# Patient Record
Sex: Male | Born: 1960 | Race: White | Hispanic: No | Marital: Married | Smoking: Never smoker
Health system: Southern US, Community
[De-identification: ages and names within clinical notes are randomized; demographics above are authoritative.]

## PROBLEM LIST (undated history)

## (undated) DIAGNOSIS — E785 Hyperlipidemia, unspecified: Secondary | ICD-10-CM

## (undated) DIAGNOSIS — N529 Male erectile dysfunction, unspecified: Secondary | ICD-10-CM

## (undated) DIAGNOSIS — I2510 Atherosclerotic heart disease of native coronary artery without angina pectoris: Secondary | ICD-10-CM

## (undated) DIAGNOSIS — Q2548 Anomalous origin of subclavian artery: Secondary | ICD-10-CM

## (undated) DIAGNOSIS — Q231 Congenital insufficiency of aortic valve: Principal | ICD-10-CM

## (undated) HISTORY — DX: Anomalous origin of subclavian artery: Q25.48

## (undated) HISTORY — DX: Male erectile dysfunction, unspecified: N52.9

## (undated) HISTORY — DX: Atherosclerotic heart disease of native coronary artery without angina pectoris: I25.10

## (undated) HISTORY — PX: OTHER SURGICAL HISTORY: SHX169

## (undated) HISTORY — DX: Hyperlipidemia, unspecified: E78.5

## (undated) HISTORY — DX: Congenital insufficiency of aortic valve: Q23.1

---

## 1997-08-07 ENCOUNTER — Other Ambulatory Visit: Admission: RE | Admit: 1997-08-07 | Discharge: 1997-08-07 | Payer: Self-pay | Admitting: Interventional Cardiology

## 1998-03-19 ENCOUNTER — Ambulatory Visit (HOSPITAL_COMMUNITY): Admission: RE | Admit: 1998-03-19 | Discharge: 1998-03-19 | Payer: Self-pay | Admitting: Emergency Medicine

## 2012-11-04 ENCOUNTER — Telehealth: Payer: Self-pay | Admitting: Interventional Cardiology

## 2012-11-04 DIAGNOSIS — I359 Nonrheumatic aortic valve disorder, unspecified: Secondary | ICD-10-CM

## 2012-11-04 DIAGNOSIS — I7789 Other specified disorders of arteries and arterioles: Secondary | ICD-10-CM

## 2012-11-04 NOTE — Telephone Encounter (Signed)
New Problem  Pt called request a call back about order a test// No other details/ Please call

## 2012-11-04 NOTE — Telephone Encounter (Signed)
returned Dr.Kroboth call. he would like to go ahead and hve ct scan w/contrast sooner that later. He would like for [place] to ck with precert first to see what the cost would be before scheduling.adv Dr.j I would f/u with precert and cll him back

## 2012-11-18 NOTE — Telephone Encounter (Signed)
called pt back with cpt code for a CT(A) [id number]. adv pt I will put orders in epic for a cta with contrast and calcium scoring pt verbalized understsnding

## 2012-11-19 NOTE — Telephone Encounter (Signed)
Left msg on pt's cell phone.  Need insurance information, i.e., insurance company name and policy# and customer svc #

## 2012-11-20 NOTE — Telephone Encounter (Signed)
called [facility] [phone] and spoke with Darl Pikes pt sch to have CT chest w/contrast.to evaluate aortic root size  on 12/02/12 @10 :30. Cheral Almas that they do not do Calcium scoring in that office. will fax order to [phone].lmom for pt with appt date and time.also adv pt they do not to calcium scoring.

## 2012-12-04 ENCOUNTER — Telehealth: Payer: Self-pay | Admitting: Interventional Cardiology

## 2012-12-04 NOTE — Telephone Encounter (Signed)
New message  Patient would like results of CT Angio, please call & advise.

## 2012-12-07 ENCOUNTER — Other Ambulatory Visit: Payer: Self-pay

## 2012-12-08 NOTE — Telephone Encounter (Signed)
called pt with resuts of cta of the chest.per Dr.Smith no aortic aneurysm,lad and cfx ca, 3-4 mm r lung nodule benign.pt to return call with any questions.

## 2012-12-08 NOTE — Telephone Encounter (Signed)
Follow Up  Requests results of the CT scan completed at [facility] on Nov 19 th// please call back

## 2012-12-09 NOTE — Telephone Encounter (Signed)
Follow up    Pt would like Dr. Katrinka Blazing to call him back to discuss the Ct results please.  Some time in the week of 12/1-5th

## 2012-12-18 ENCOUNTER — Encounter: Payer: Self-pay | Admitting: Interventional Cardiology

## 2013-09-30 ENCOUNTER — Telehealth: Payer: Self-pay | Admitting: Interventional Cardiology

## 2013-09-30 DIAGNOSIS — I359 Nonrheumatic aortic valve disorder, unspecified: Secondary | ICD-10-CM

## 2013-09-30 NOTE — Telephone Encounter (Signed)
New Message  Pt called requests a call back to clarify if he will need an ECHO and lab work before appt. Please call

## 2013-09-30 NOTE — Telephone Encounter (Signed)
Left message that message would be sent to Dr. Katrinka Blazing.

## 2013-10-01 NOTE — Telephone Encounter (Signed)
Lmom. Pt should have echo performed some time before his appt with Dr.Smith on 12/15/13. A scheduler form the office will call pt to schedule

## 2013-10-01 NOTE — Telephone Encounter (Signed)
Last echo was at [name] in August 2013. I believe he needs to have a repeat 2-D Doppler echocardiogram to follow aortic valve disease. It would be nice to have this done prior to the office visit.

## 2013-10-06 ENCOUNTER — Ambulatory Visit (HOSPITAL_COMMUNITY): Payer: PRIVATE HEALTH INSURANCE | Attending: Interventional Cardiology

## 2013-10-06 DIAGNOSIS — I359 Nonrheumatic aortic valve disorder, unspecified: Secondary | ICD-10-CM | POA: Diagnosis not present

## 2013-10-06 NOTE — Progress Notes (Signed)
2D Echo completed. 10/06/2013 

## 2013-10-08 ENCOUNTER — Telehealth: Payer: Self-pay

## 2013-10-08 NOTE — Telephone Encounter (Signed)
Message copied by Jarvis Newcomer on Fri Oct 08, 2013  4:38 PM ------      Message from: Verdis Prime      Created: Thu Oct 07, 2013  6:57 PM       Normal LV size and function. Aortic valve with Ao stenosis mild to moderate. Normal measured Ao root. ------

## 2013-10-08 NOTE — Telephone Encounter (Signed)
called to give pt echo results.lmtcb 

## 2013-10-12 NOTE — Telephone Encounter (Signed)
Patient informed. States he will discuss more about echo when he sees Dr. Katrinka BlazingSmith.

## 2013-10-12 NOTE — Telephone Encounter (Signed)
Follow Up  ° °Pt returned call for results °

## 2013-10-12 NOTE — Telephone Encounter (Signed)
Left message for pt to call back for results of 2 D Echo

## 2013-11-23 ENCOUNTER — Encounter: Payer: Self-pay | Admitting: Interventional Cardiology

## 2013-12-15 ENCOUNTER — Ambulatory Visit: Payer: Self-pay | Admitting: Interventional Cardiology

## 2014-02-09 ENCOUNTER — Ambulatory Visit: Payer: Self-pay | Admitting: Interventional Cardiology

## 2014-02-11 ENCOUNTER — Ambulatory Visit: Payer: Self-pay | Admitting: Interventional Cardiology

## 2014-03-07 ENCOUNTER — Encounter: Payer: Self-pay | Admitting: Interventional Cardiology

## 2014-03-07 ENCOUNTER — Ambulatory Visit (INDEPENDENT_AMBULATORY_CARE_PROVIDER_SITE_OTHER): Payer: PRIVATE HEALTH INSURANCE | Admitting: Interventional Cardiology

## 2014-03-07 VITALS — BP 104/80 | HR 45 | Ht 66.5 in | Wt 150.8 lb

## 2014-03-07 DIAGNOSIS — I351 Nonrheumatic aortic (valve) insufficiency: Secondary | ICD-10-CM | POA: Insufficient documentation

## 2014-03-07 DIAGNOSIS — E785 Hyperlipidemia, unspecified: Secondary | ICD-10-CM | POA: Insufficient documentation

## 2014-03-07 DIAGNOSIS — N529 Male erectile dysfunction, unspecified: Secondary | ICD-10-CM | POA: Insufficient documentation

## 2014-03-07 DIAGNOSIS — Q231 Congenital insufficiency of aortic valve: Secondary | ICD-10-CM

## 2014-03-07 DIAGNOSIS — Q2381 Bicuspid aortic valve: Secondary | ICD-10-CM

## 2014-03-07 HISTORY — DX: Congenital insufficiency of aortic valve: Q23.1

## 2014-03-07 HISTORY — DX: Bicuspid aortic valve: Q23.81

## 2014-03-07 HISTORY — DX: Hyperlipidemia, unspecified: E78.5

## 2014-03-07 HISTORY — DX: Male erectile dysfunction, unspecified: N52.9

## 2014-03-07 NOTE — Progress Notes (Signed)
Patient ID: Duane Arnold Mcalpine, male   DOB: 05/22/1960, 54 y.o.   MRN: [id number]     Cardiology Office Note   Date:  03/07/2014   ID:  Duane Arnold Wasilewski, DOB 05/17/1960, MRN [id number]  PCP:  No primary care provider on file.  Cardiologist:   Lesleigh NoeSMITH III,HENRY W, MD   No chief complaint on file.     History of Present Illness: Duane Arnold Lad is a 54 y.o. male who presents for a crisp aortic valve. He is asymptomatic. He continues exercises I had syncope, dyspnea, or chest discomfort. No palpitations or neurological complaints. He is on atorvastatin 40 mg per day.Pete Glatter.Stoneking is now his primary care physician.    History reviewed. No pertinent past medical history.  Past Surgical History  Procedure Laterality Date  . None       Current Outpatient Prescriptions  Medication Sig Dispense Refill  . aspirin 81 MG tablet Take 81 mg by mouth daily.    Marland Kitchen. atorvastatin (LIPITOR) 40 MG tablet Take 40 mg by mouth daily.    . sildenafil (VIAGRA) 50 MG tablet Take 50 mg by mouth daily as needed for erectile dysfunction.    Marland Kitchen. zolpidem (AMBIEN) 10 MG tablet Take 10 mg by mouth at bedtime as needed for sleep.     No current facility-administered medications for this visit.    Allergies:   Review of patient's allergies indicates no known allergies.    Social History:  The patient  reports that he has never smoked. He does not have any smokeless tobacco history on file. He reports that he drinks about 0.6 oz of alcohol per week. He reports that he does not use illicit drugs.   Family History:  The patient's family history includes Dementia in his mother; Hypertension in his father and mother.    ROS:  Please see the history of present illness.   Otherwise, review of systems are positive for none.   All other systems are reviewed and negative.    PHYSICAL EXAM: VS:  BP 104/80 mmHg  Pulse 45  Ht 5' 6.5" (1.689 m)  Wt 150 lb 12.8 oz (68.402 kg)  BMI 23.98 kg/m2 , BMI Body mass index is 23.98  kg/(m^2). GEN: Well nourished, well developed, in no acute distress HEENT: normal Neck: no JVD, carotid bruits, or masses Cardiac: RRR; real 6 systolic crescendo decrescendo murmur at the right upper sternal border and left midsternal border. Otherwise rub and no edema . An S4 gallop is audible. Respiratory:  clear to auscultation bilaterally, normal work of breathing GI: soft, nontender, nondistended, + BS MS: no deformity or atrophy Skin: warm and dry, no rash Neuro:  Strength and sensation are intact Psych: euthymic mood, full affect   EKG:  EKG is ordered today. The ekg ordered today demonstrates sinus bradycardia with prominent voltage. PR interval 234 ms. EKG is unchanged from prior tracings.   Recent Labs: No results found for requested labs within last 365 days.    Lipid Panel No results found for: CHOL, TRIG, HDL, CHOLHDL, VLDL, LDLCALC, LDLDIRECT    Wt Readings from Last 3 Encounters:  03/07/14 150 lb 12.8 oz (68.402 kg)      Other studies Reviewed: Additional studies/ records that were reviewed today include: .    ASSESSMENT AND PLAN:  1.  Bicuspid aortic valve, clinically stable without any evidence of volume overload. We reviewed the results of the recent echocardiogram from September 2015. There has been mild progression compared with a study done in 2013.  2. The ascending aorta is normal in size.   Current medicines are reviewed at length with the patient today.  The patient does not have concerns regarding medicines.  The following changes have been made:  no change  Labs/ tests ordered today include: The Doppler echocardiogram September 2016  No orders of the defined types were placed in this encounter.     Disposition:   FU with Mendel Ryder in 7 months     Signed, Lesleigh Noe, MD  03/07/2014 2:42 PM    [facility] [address] [place], [place], [place]  [address] Phone: [phone]; Fax: [phone]

## 2014-03-07 NOTE — Patient Instructions (Addendum)
Your physician recommends that you continue on your current medications as directed. Please refer to the Current Medication list given to you today.  Your physician has requested that you have an echocardiogram. Echocardiography is a painless test that uses sound waves to create images of your heart. It provides your doctor with information about the size and shape of your heart and how well your heart's chambers and valves are working. This procedure takes approximately one hour. There are no restrictions for this procedure.( To be scheduled in September 2016)   Your physician wants you to follow-up in: November 2016 You will receive a reminder letter in the mail two months in advance. If you don't receive a letter, please call our office to schedule the follow-up appointment.

## 2014-10-25 ENCOUNTER — Telehealth: Payer: Self-pay

## 2014-10-25 NOTE — Telephone Encounter (Signed)
F/u  Pt returning Lisa's phone call. Please call back and discuss 

## 2014-10-25 NOTE — Telephone Encounter (Signed)
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 lmtcb

## 2014-10-25 NOTE — Telephone Encounter (Signed)
Pt aware of 1 yr f/u scheduled with Dr.Smith on 11/14 @ 12:15pm. Adv pt that a scheduler will call him to schedule his echo prior to his appt. Pt voiced appreciation and verbalized understanding.

## 2014-11-01 ENCOUNTER — Ambulatory Visit (HOSPITAL_COMMUNITY): Payer: PRIVATE HEALTH INSURANCE | Attending: Internal Medicine

## 2014-11-01 ENCOUNTER — Other Ambulatory Visit: Payer: Self-pay

## 2014-11-01 ENCOUNTER — Other Ambulatory Visit: Payer: Self-pay | Admitting: Interventional Cardiology

## 2014-11-01 DIAGNOSIS — I350 Nonrheumatic aortic (valve) stenosis: Secondary | ICD-10-CM | POA: Diagnosis not present

## 2014-11-01 DIAGNOSIS — I340 Nonrheumatic mitral (valve) insufficiency: Secondary | ICD-10-CM | POA: Insufficient documentation

## 2014-11-01 DIAGNOSIS — I059 Rheumatic mitral valve disease, unspecified: Secondary | ICD-10-CM | POA: Insufficient documentation

## 2014-11-01 DIAGNOSIS — Q231 Congenital insufficiency of aortic valve: Secondary | ICD-10-CM | POA: Diagnosis not present

## 2014-11-01 DIAGNOSIS — I517 Cardiomegaly: Secondary | ICD-10-CM | POA: Diagnosis not present

## 2014-11-27 DIAGNOSIS — Q2548 Anomalous origin of subclavian artery: Secondary | ICD-10-CM | POA: Insufficient documentation

## 2014-11-27 HISTORY — DX: Anomalous origin of subclavian artery: Q25.48

## 2014-11-28 ENCOUNTER — Ambulatory Visit (INDEPENDENT_AMBULATORY_CARE_PROVIDER_SITE_OTHER): Payer: PRIVATE HEALTH INSURANCE | Admitting: Interventional Cardiology

## 2014-11-28 ENCOUNTER — Encounter: Payer: Self-pay | Admitting: Interventional Cardiology

## 2014-11-28 VITALS — BP 106/72 | HR 53 | Ht 67.0 in | Wt 153.8 lb

## 2014-11-28 DIAGNOSIS — Q2548 Anomalous origin of subclavian artery: Secondary | ICD-10-CM | POA: Diagnosis not present

## 2014-11-28 DIAGNOSIS — Q231 Congenital insufficiency of aortic valve: Secondary | ICD-10-CM

## 2014-11-28 DIAGNOSIS — I7789 Other specified disorders of arteries and arterioles: Secondary | ICD-10-CM

## 2014-11-28 DIAGNOSIS — I2510 Atherosclerotic heart disease of native coronary artery without angina pectoris: Secondary | ICD-10-CM

## 2014-11-28 DIAGNOSIS — E785 Hyperlipidemia, unspecified: Secondary | ICD-10-CM

## 2014-11-28 HISTORY — DX: Atherosclerotic heart disease of native coronary artery without angina pectoris: I25.10

## 2014-11-28 NOTE — Patient Instructions (Signed)
Medication Instructions:  Your physician recommends that you continue on your current medications as directed. Please refer to the Current Medication list given to you today.   Labwork: None ordered  Testing/Procedures: Your physician has requested that you have an echocardiogram. Echocardiography is a painless test that uses sound waves to create images of your heart. It provides your doctor with information about the size and shape of your heart and how well your heart's chambers and valves are working. This procedure takes approximately one hour. There are no restrictions for this procedure. ( To be scheduled in Nov 2017)  Non-Cardiac CT Angiography (CTA), is a special type of CT scan that uses a computer to produce multi-dimensional views of major blood vessels throughout the body. In CT angiography, a contrast material is injected through an IV to help visualize the blood vessels (To be scheduled in Nov 2017)   Follow-Up: Your physician wants you to follow-up in: 1 year with Dr.Smith will receive a reminder letter in the mail two months in advance. If you don't receive a letter, please call our office to schedule the follow-up appointment.   Any Other Special Instructions Will Be Listed Below (If Applicable).     If you need a refill on your cardiac medications before your next appointment, please call your pharmacy.

## 2014-11-28 NOTE — Progress Notes (Signed)
Cardiology Office Note   Date:  11/28/2014   ID:  Duane SouSam Noori, DOB 05/30/1960, MRN [id number]  PCP:  Ginette OttoSTONEKING,HAL THOMAS, MD  Cardiologist:  Lesleigh NoeSMITH III,Christoph Copelan W, MD   Chief Complaint  Patient presents with  . Cardiac Valve Problem      History of Present Illness: Duane SouSam Arnold is a 54 y.o. 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  No current complaints of dyspnea , chest discomfort, syncope, or exertional intolerance.  He denies palpitations.    No past medical history on file.  Past Surgical History  Procedure Laterality Date  . None       Current Outpatient Prescriptions  Medication Sig Dispense Refill  . aspirin 81 MG tablet Take 81 mg by mouth daily.    Marland Kitchen. atorvastatin (LIPITOR) 40 MG tablet Take 40 mg by mouth daily.    . sildenafil (VIAGRA) 50 MG tablet Take 50 mg by mouth daily as needed for erectile dysfunction.    Marland Kitchen. zolpidem (AMBIEN) 10 MG tablet Take 10 mg by mouth at bedtime as needed for sleep.     No current facility-administered medications for this visit.    Allergies:   Review of patient's allergies indicates no known allergies.    Social History:  The patient  reports that he has never smoked. He has never used smokeless tobacco. He reports that he drinks about 0.6 oz of alcohol per week. He reports that he does not use illicit drugs.   Family History:  The patient's family history includes Dementia in his mother; Hypertension in his father and mother.    ROS:  Please see the history of present illness.   Otherwise, review of systems are positive for  none.   All other systems are reviewed and negative.    PHYSICAL EXAM: VS:  BP 106/72 mmHg  Pulse 53  Ht 5\' 7"  (1.702 m)  Wt 69.763 kg (153 lb 12.8 oz)  BMI 24.08 kg/m2  SpO2 96% , BMI Body mass index is  24.08 kg/(m^2). GEN: Well nourished, well developed, in no acute distress HEENT: normal Neck: no JVD, carotid bruits, or masses Cardiac: RRR.  There is 3/6 Creascendo-Decrescendo  Murmur of AS. No rub, or gallop. There is no edema. Respiratory:  clear to auscultation bilaterally, normal work of breathing. GI: soft, nontender, nondistended, + BS MS: no deformity or atrophy Skin: warm and dry, no rash Neuro:  Strength and sensation are intact Psych: euthymic mood, full affect   EKG:  EKG is not ordered today. Last tracing performed in February 2016 reveals sinus bradycardia an prominent voltage.    Recent Labs: No results found for requested labs within last 365 days.    Lipid Panel No results found for: CHOL, TRIG, HDL, CHOLHDL, VLDL, LDLCALC, LDLDIRECT    Wt Readings from Last 3 Encounters:  11/28/14 69.763 kg (153 lb 12.8 oz)  03/07/14 68.402 kg (150 lb 12.8 oz)      Other studies Reviewed: Additional studies/ records that were reviewed today include:  Pulse recently performed echo, CT scan performed in 2014. The findings include  Identified normal aortic root and ascending aortic diameter. 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    ASSESSMENT AND PLAN:  1. Bicuspid aortic valve  Clinically suspected to be  a bicuspid valve although CT scan suggests the presence of 3 leaflets. 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 The aortic valve velocity is 3.48 meters per second. This is slightly higher than 3.13 in 2013. LV function is normal and there is mild LVH  2. Aortic root enlargement (HCC)  normal aortic root size  3. Hyperlipidemia  on therapy and followed by primary care. Continue primary prevention with statin therapy.  4. Anomalous origin of right subclavian artery  coincidentally discovered on CT scan performed in 2014.    Current medicines are reviewed at  length with the patient today.  The patient has the following concerns regarding medicines:  none.  The following changes/actions have been instituted:      We re-discussed method of follow-up. We will perform a repeat CT scan to measure aortic size in one year. An echo will also be done at that time.   Because of coronary calcification , we discussed whether or not myocardial perfusion imaging would be helpful. He had a normal study done in 2010. With his regular and persistent exercise without symptoms, I do not feel this is indicated at this time.   continue primary prevention with peripheral stick coronary disease with statin therapy.  Labs/ tests ordered today include:  No orders of the defined types were placed in this encounter.     Disposition:   FU with HS in 1 year  Signed, Lesleigh Noe, MD  11/28/2014 1:34 PM    [facility] [address] [place], [place], [place]  [address] Phone: [phone]; Fax: [phone]

## 2015-11-21 ENCOUNTER — Ambulatory Visit (HOSPITAL_COMMUNITY): Payer: PRIVATE HEALTH INSURANCE | Attending: Cardiology

## 2015-11-21 ENCOUNTER — Other Ambulatory Visit: Payer: Self-pay

## 2015-11-21 DIAGNOSIS — Q231 Congenital insufficiency of aortic valve: Secondary | ICD-10-CM | POA: Diagnosis present

## 2015-11-21 DIAGNOSIS — Q2381 Bicuspid aortic valve: Secondary | ICD-10-CM

## 2015-11-23 ENCOUNTER — Telehealth: Payer: Self-pay | Admitting: Interventional Cardiology

## 2015-11-23 NOTE — Telephone Encounter (Signed)
Left message to call back  

## 2015-11-23 NOTE — Telephone Encounter (Signed)
Advised pt of echo results. Pt verbalized understanding.

## 2015-11-23 NOTE — Telephone Encounter (Signed)
F/u Message ° °Pt returning RN call about results. Please call back to discuss  °

## 2015-12-22 ENCOUNTER — Encounter: Payer: Self-pay | Admitting: Interventional Cardiology

## 2015-12-28 ENCOUNTER — Encounter: Payer: Self-pay | Admitting: Interventional Cardiology

## 2015-12-28 ENCOUNTER — Ambulatory Visit (INDEPENDENT_AMBULATORY_CARE_PROVIDER_SITE_OTHER): Payer: PRIVATE HEALTH INSURANCE | Admitting: Interventional Cardiology

## 2015-12-28 VITALS — BP 124/88 | HR 48 | Ht 67.0 in | Wt 155.0 lb

## 2015-12-28 DIAGNOSIS — I2510 Atherosclerotic heart disease of native coronary artery without angina pectoris: Secondary | ICD-10-CM

## 2015-12-28 DIAGNOSIS — Q231 Congenital insufficiency of aortic valve: Secondary | ICD-10-CM | POA: Diagnosis not present

## 2015-12-28 DIAGNOSIS — N529 Male erectile dysfunction, unspecified: Secondary | ICD-10-CM

## 2015-12-28 DIAGNOSIS — E785 Hyperlipidemia, unspecified: Secondary | ICD-10-CM | POA: Diagnosis not present

## 2015-12-28 DIAGNOSIS — Q2548 Anomalous origin of subclavian artery: Secondary | ICD-10-CM

## 2015-12-28 NOTE — Patient Instructions (Signed)
Medication Instructions:  None  Labwork: None  Testing/Procedures: Your physician has requested that you have en exercise stress myoview. For further information please visit [email or web address]. Please follow instruction sheet, as given.  Your physician has requested that you have an echocardiogram a month or so prior to your next follow up visit. Echocardiography is a painless test that uses sound waves to create images of your heart. It provides your doctor with information about the size and shape of your heart and how well your heart's chambers and valves are working. This procedure takes approximately one hour. There are no restrictions for this procedure.    Follow-Up: Your physician wants you to follow-up in: 1 year with Dr. Katrinka BlazingSmith.  You will receive a reminder letter in the mail two months in advance. If you don't receive a letter, please call our office to schedule the follow-up appointment.   Any Other Special Instructions Will Be Listed Below (If Applicable).     If you need a refill on your cardiac medications before your next appointment, please call your pharmacy.

## 2015-12-28 NOTE — Progress Notes (Signed)
Cardiology Office Note    Date:  12/28/2015   ID:  Duane SouSam Arnold, DOB 11/27/1960, MRN [id number]  PCP:  Ginette OttoSTONEKING,HAL THOMAS, MD  Cardiologist: Lesleigh NoeHenry W Rini Moffit III, MD   Chief Complaint  Patient presents with  . Cardiac Valve Problem    History of Present Illness:  Duane Arnold is a 55 y.o. male with history of bicuspid aortic valve with aortic stenosis, hyperlipidemia, coronary artery calcification by CT and erectile dysfunction.  He is asymptomatic. He has not had syncope, dyspnea, or chest discomfort. Exercises vigorously without difficulty. Exercises aerobic rather than isometric  There are no medication side effects.  Past Medical History:  Diagnosis Date  . 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 Bicuspid aortic valve 03/07/2014   ECHOCARDIOGRAM, 2015  Aortic valve: Possibly bicuspid; moderately thickened, moderately calcified leaflets. Cusp separation was reduced. There was moderate stenosis. Peak velocity is 3.4777m/s with a mean gradient of 27mmHg.   Marland Kitchen. Coronary artery calcification seen on CAT scan 11/28/2014    2014   . Erectile dysfunction 03/07/2014  . Hyperlipidemia 03/07/2014    Past Surgical History:  Procedure Laterality Date  . none      Current Medications: Outpatient Medications Prior to Visit  Medication Sig Dispense Refill  . aspirin 81 MG tablet Take 81 mg by mouth daily.    Marland Kitchen. atorvastatin (LIPITOR) 40 MG tablet Take 40 mg by mouth daily.    . sildenafil (VIAGRA) 50 MG tablet Take 50 mg by mouth daily as needed for erectile dysfunction.    Marland Kitchen. zolpidem (AMBIEN) 10 MG tablet Take 10 mg by mouth at bedtime as needed for sleep.     No facility-administered medications prior to visit.      Allergies:   Patient has no known allergies.   Social History   Social History  . Marital status: Married    Spouse name: tomasita  . Number of children: 1  . Years of education: college    Occupational History  . doctor    Social History Main Topics  . Smoking status: Never Smoker  . Smokeless tobacco: Never Used  . Alcohol use 0.6 oz/week    1 Glasses of wine per week  . Drug use: No  . Sexual activity: Not Asked   Other Topics Concern  . None   Social History Narrative  . None     Family History:  The patient's family history includes Dementia in his mother; Hypertension in his father and mother.   ROS:   Please see the history of present illness.    None  All other systems reviewed and are negative.   PHYSICAL EXAM:   VS:  BP 124/88 (BP Location: Left Arm)   Pulse (!) 48   Ht 5\' 7"  (1.702 m)   Wt 155 lb (70.3 kg)   BMI 24.28 kg/m    GEN: Well nourished, well developed, in no acute distress  HEENT: normal  Neck: no JVD, or masses. He has bilaterally transmitted carotid bruits from the aortic valve. Carotid upstroke is 2+ bilaterally. Cardiac: RRR; rubs, or gallops,no edema. 2 to 3/6 crescendo decrescendo systolic murmur of aortic stenosis. No diastolic murmurs heard.  Respiratory:  clear to auscultation bilaterally, normal work of breathing GI: soft, nontender, nondistended, + BS MS: no deformity or atrophy  Skin: warm and dry, no rash Neuro:  Alert and Oriented x 3, Strength and sensation are intact Psych: euthymic mood,  full affect  Wt Readings from Last 3 Encounters:  12/28/15 155 lb (70.3 kg)  11/28/14 153 lb 12.8 oz (69.8 kg)  03/07/14 150 lb 12.8 oz (68.4 kg)      Studies/Labs Reviewed:   EKG:  EKG  First-degree AV block, prominent voltage, and sinus bradycardia.  Recent Labs: No results found for requested labs within last 8760 hours.   Lipid Panel No results found for: CHOL, TRIG, HDL, CHOLHDL, VLDL, LDLCALC, LDLDIRECT  Additional studies/ records that were reviewed today include:  We compared and contrasted the echocardiogram data from 2013 to the present. Peak aortic valve velocity has remained less than 3.5 m/s. The 2013  velocity was 3.11 m/s.  CT scan performed in 2014 revealed coronary calcification involving the LAD and circumflex.  CT in 2014 demonstrated maximum aortic root diameter of 3.6 cm. Most recent echocardiogram 2017 demonstrated an aortic root diameter of 3.4 cm.    ASSESSMENT:    1. Bicuspid aortic valve   2. Coronary artery calcification seen on CAT scan   3. Anomalous origin of right subclavian artery   4. Hyperlipidemia, unspecified hyperlipidemia type   5. Vasculogenic erectile dysfunction, unspecified vasculogenic erectile dysfunction type      PLAN:  In order of problems listed above:  1. Clinically moderate aortic stenosis. The patient is asymptomatic. I cautioned against isometric and extremely heavy aerobic physical activity. Also cautioned to call of chest discomfort, dyspnea, or syncope. Plan follow-up echo in one year as well as office visit in one year. 2. Stress Myoview is recommended because of history of coronary calcification and the patient's exercise program which includes moderate to heavy aerobic activity. We want to exclude the possibility of silent ischemia that could be hazardous 3. Not addressed. 4. LDL target is 70 or less with ordinary calcification having been noted in the past. 5. Continue PD 5 inhibitor therapy.  Clinical follow-up in one year.  Medication Adjustments/Labs and Tests Ordered: Current medicines are reviewed at length with the patient today.  Concerns regarding medicines are outlined above.  Medication changes, Labs and Tests ordered today are listed in the Patient Instructions below. There are no Patient Instructions on file for this visit.   Signed, Lesleigh NoeHenry W Chakia Counts III, MD  12/28/2015 9:15 AM    [facility] [address], [place], [place]  [address] Phone: [phone]; Fax: [phone]

## 2016-02-02 ENCOUNTER — Telehealth (HOSPITAL_COMMUNITY): Payer: Self-pay | Admitting: Interventional Cardiology

## 2016-02-05 NOTE — Telephone Encounter (Signed)
Close encounter 

## 2016-02-08 ENCOUNTER — Encounter (HOSPITAL_COMMUNITY): Payer: PRIVATE HEALTH INSURANCE

## 2016-02-12 ENCOUNTER — Telehealth (HOSPITAL_COMMUNITY): Payer: Self-pay | Admitting: *Deleted

## 2016-02-12 NOTE — Telephone Encounter (Signed)
Left message on voicemail in reference to upcoming appointment scheduled for 02/14/16. Phone number given for a call back so details instructions can be given. Duane Arnold, Adelene IdlerCynthia W

## 2016-02-13 ENCOUNTER — Telehealth: Payer: Self-pay | Admitting: *Deleted

## 2016-02-13 NOTE — Telephone Encounter (Signed)
Follow Up: ° ° ° °Returning your call from yesterday. °

## 2016-02-14 ENCOUNTER — Ambulatory Visit (HOSPITAL_COMMUNITY): Payer: PRIVATE HEALTH INSURANCE | Attending: Cardiovascular Disease

## 2016-02-14 DIAGNOSIS — I2510 Atherosclerotic heart disease of native coronary artery without angina pectoris: Secondary | ICD-10-CM

## 2016-02-14 DIAGNOSIS — Q2548 Anomalous origin of subclavian artery: Secondary | ICD-10-CM | POA: Diagnosis not present

## 2016-02-14 DIAGNOSIS — Q231 Congenital insufficiency of aortic valve: Secondary | ICD-10-CM | POA: Insufficient documentation

## 2016-02-14 DIAGNOSIS — E785 Hyperlipidemia, unspecified: Secondary | ICD-10-CM | POA: Diagnosis not present

## 2016-02-14 DIAGNOSIS — N529 Male erectile dysfunction, unspecified: Secondary | ICD-10-CM | POA: Diagnosis not present

## 2016-02-14 LAB — MYOCARDIAL PERFUSION IMAGING
CHL CUP NUCLEAR SDS: 1
CHL CUP NUCLEAR SRS: 0
CHL CUP RESTING HR STRESS: 49 {beats}/min
CSEPED: 13 min
CSEPHR: 92 %
CSEPPHR: 151 {beats}/min
Estimated workload: 16.2 METS
Exercise duration (sec): 30 s
LV sys vol: 48 mL
LVDIAVOL: 113 mL (ref 62–150)
MPHR: 163 {beats}/min
RATE: 0.23
SSS: 1
TID: 0.92

## 2016-02-14 MED ORDER — TECHNETIUM TC 99M TETROFOSMIN IV KIT
10.4000 | PACK | Freq: Once | INTRAVENOUS | Status: AC | PRN
Start: 2016-02-14 — End: 2016-02-14
  Administered 2016-02-14: 10.4 via INTRAVENOUS
  Filled 2016-02-14: qty 11

## 2016-02-14 MED ORDER — TECHNETIUM TC 99M TETROFOSMIN IV KIT
33.0000 | PACK | Freq: Once | INTRAVENOUS | Status: AC | PRN
  Administered 2016-02-14: 33 via INTRAVENOUS
  Filled 2016-02-14: qty 33

## 2016-02-20 ENCOUNTER — Encounter: Payer: Self-pay | Admitting: Interventional Cardiology

## 2016-02-29 ENCOUNTER — Ambulatory Visit (INDEPENDENT_AMBULATORY_CARE_PROVIDER_SITE_OTHER): Payer: PRIVATE HEALTH INSURANCE | Admitting: Sports Medicine

## 2016-02-29 ENCOUNTER — Encounter: Payer: Self-pay | Admitting: Sports Medicine

## 2016-02-29 DIAGNOSIS — M79602 Pain in left arm: Secondary | ICD-10-CM | POA: Diagnosis not present

## 2016-02-29 NOTE — Progress Notes (Signed)
  Doug SouSam Gombert - 56 y.o. male MRN [id number]  Date of birth: 08/21/1960  SUBJECTIVE:  Including CC & ROS.  No chief complaint on file. 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 Arm." Describes intermittent 4/10  "burning" pain in L. Arm of 1 months duration. Reports most pain comes from his hand, but extends up his forearm into his brachium in a "stocking and glove" distribution. Reports associated hyperemia during periods of pain. Denies pattern or aggravating factors. Denies weakness or loss of sensation. Denies trauma or other significant changes in his life. Denies association w/ temperature.  Of note, patient reports having a viral illness around the time the pain began.   ROS- As per HPI. No night sweats or weight loss.   HISTORY: Past Medical, Surgical, Social, and Family History Reviewed & Updated per EMR.   Pertinent Historical Findings include: Anomalous origin of r. Subclavian artery Bicuspid aortic valve  DATA REVIEWED: NA  PHYSICAL EXAM:  VS: BP:118/76  HR:61bpm  TEMP: ( )  RESP:   HT:5\' 7"  (170.2 cm)   WT:150 lb (68 kg)  BMI:23.5 PHYSICAL EXAM: General: well-appearing M in NAD, A&Ox3  MSK: - Forward flexion of neck - decreased bulk overlying L. Deltoid - hyperemia of dorsal aspect of L. Hand - no PTP over medial/lateral condyles of elbow - 5/5 strength to shoulder abduction, arm flexion/extension, wrist flexion/extension, intrinsic muscles of hand - 5/5 strength in RC muscles - Neer's and Hawkins test negative - Vulcan grip negative - Pulse stable with arm extended overhead/externally rotated  ASSESSMENT & PLAN: See problem based charting & AVS for pt instructions. 56 yo M w/ no significant PMH in w/ L. Limb pain in stocking and glove distribution w/ associated hyperemia on exam. DDx: Post-viral RSD, Brachial plexitis, thoracic outlet obstruction/impingement, malignancy. Given proximity to viral illness  w/ no obvious symptoms outside of hyperemia and pain, RSD most likely. No weakness w/ negative vulcan grip makes brachial plexitis (at least of upper roots) unlikely. No pulse variance w/ movement/provacative test to indicate vascular thoracic outlet obstruction. Will pursue conservative management at this time including vitamin B6 to stimulate healing of possible brachial plexitis w/ stretching to improve possible thoracic obstruction/impingement. If no improvement, consider CXR. - Vitamin B6 supplementation - "H" and "T" stretch - Upright row, "robbery" and "lawn mower" exercises

## 2016-02-29 NOTE — Assessment & Plan Note (Signed)
We will follow this as difficult to be sure of a DX today Post viral? Idiopathic?  Coservative care and follow

## 2016-04-02 ENCOUNTER — Ambulatory Visit: Payer: PRIVATE HEALTH INSURANCE | Admitting: Sports Medicine

## 2016-05-07 ENCOUNTER — Other Ambulatory Visit: Payer: Self-pay | Admitting: *Deleted

## 2016-05-07 MED ORDER — NORTRIPTYLINE HCL 25 MG PO CAPS: 25.0000 mg | ORAL_CAPSULE | Freq: Every day | ORAL | 3 refills | Status: DC

## 2016-06-06 ENCOUNTER — Other Ambulatory Visit: Payer: Self-pay | Admitting: Geriatric Medicine

## 2016-06-06 DIAGNOSIS — M25512 Pain in left shoulder: Secondary | ICD-10-CM

## 2016-06-21 ENCOUNTER — Ambulatory Visit
Admission: RE | Admit: 2016-06-21 | Discharge: 2016-06-21 | Disposition: A | Discharge status: Home / Self Care | Payer: PRIVATE HEALTH INSURANCE | Source: Ambulatory Visit | Attending: Geriatric Medicine | Admitting: Geriatric Medicine

## 2016-06-21 DIAGNOSIS — M25512 Pain in left shoulder: Secondary | ICD-10-CM

## 2016-12-12 ENCOUNTER — Other Ambulatory Visit (HOSPITAL_COMMUNITY): Payer: PRIVATE HEALTH INSURANCE

## 2016-12-13 ENCOUNTER — Other Ambulatory Visit: Payer: Self-pay

## 2016-12-13 ENCOUNTER — Ambulatory Visit (HOSPITAL_COMMUNITY): Payer: PRIVATE HEALTH INSURANCE | Attending: Cardiology

## 2016-12-13 DIAGNOSIS — I420 Dilated cardiomyopathy: Secondary | ICD-10-CM | POA: Diagnosis not present

## 2016-12-13 DIAGNOSIS — I5030 Unspecified diastolic (congestive) heart failure: Secondary | ICD-10-CM | POA: Insufficient documentation

## 2016-12-13 DIAGNOSIS — I062 Rheumatic aortic stenosis with insufficiency: Secondary | ICD-10-CM | POA: Diagnosis not present

## 2016-12-13 DIAGNOSIS — Q231 Congenital insufficiency of aortic valve: Secondary | ICD-10-CM

## 2016-12-18 ENCOUNTER — Telehealth: Payer: Self-pay | Admitting: Interventional Cardiology

## 2016-12-18 NOTE — Telephone Encounter (Signed)
Informed pt of echo results.  Dr. Katrinka BlazingSmith sitting next to me and spoke with pt about results as well.  Questions answered.  Pt appreciative for call.

## 2016-12-18 NOTE — Telephone Encounter (Signed)
New message ° ° ° ° °Patient returning call for results. Please call °

## 2017-01-23 NOTE — Progress Notes (Signed)
Cardiology Office Note    Date:  01/24/2017   ID:  Duane SouSam Champoux, MD, DOB 03/29/1960, MRN [id number]  PCP:  Merlene LaughterStoneking, Hal, MD  Cardiologist: Lesleigh NoeHenry W Rafferty Postlewait III, MD   Chief Complaint  Patient presents with  . 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   He is doing well.  He is concerned about progression of the valve.  Under no circumstances does he want sternotomy with surgical valve replacement.  We talked about absence of approval for TAVR in low risk patients although the trial is being done and hopefully will demonstrate benefit in the low risk population as it has in the intermediate risk.  He denies syncope, chest discomfort, and dyspnea.  He rarely notices 5-10 seconds of lightheadedness if he goes from a lying position to standing.  This may happen once or twice per month.   Past Medical History:  Diagnosis Date  . 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 Bicuspid aortic valve 03/07/2014   ECHOCARDIOGRAM, 2015  Aortic valve: Possibly bicuspid; moderately thickened, moderately calcified leaflets. Cusp separation was reduced. There was moderate stenosis. Peak velocity is 3.574m/s with a mean gradient of 27mmHg.   Marland Kitchen. Coronary artery calcification seen on CAT scan 11/28/2014    2014   . Erectile dysfunction 03/07/2014  . Hyperlipidemia 03/07/2014    Past Surgical History:  Procedure Laterality Date  . none      Current Medications: Outpatient Medications Prior to Visit  Medication Sig Dispense Refill  . aspirin 81 MG tablet Take 81 mg by mouth daily.    Marland Kitchen. atorvastatin (LIPITOR) 40 MG tablet Take 40 mg by mouth daily.    . sildenafil (VIAGRA) 50 MG tablet Take 50 mg by mouth daily as needed for erectile  dysfunction.    . nortriptyline (PAMELOR) 25 MG capsule Take 1 capsule (25 mg total) by mouth at bedtime. 30 capsule 3  . zolpidem (AMBIEN) 10 MG tablet Take 10 mg by mouth at bedtime as needed for sleep.     No facility-administered medications prior to visit.      Allergies:   Patient has no known allergies.   Social History   Socioeconomic History  . Marital status: Married    Spouse name: tomasita  . Number of children: 1  . Years of education: college  . Highest education level: None  Social Needs  . Financial resource strain: None  . Food insecurity - worry: None  . Food insecurity - inability: None  . Transportation needs - medical: None  . Transportation needs - non-medical: None  Occupational History  . Occupation: doctor  Tobacco Use  . Smoking status: Never Smoker  . Smokeless tobacco: Never Used  Substance and Sexual Activity  . Alcohol use: Yes    Alcohol/week: 0.6 oz    Types: 1 Glasses of wine per week  . Drug use: No  . Sexual activity: None  Other Topics Concern  . None  Social History Narrative  . None     Family History:  The patient's family history includes Dementia in his mother; Hypertension in his father and mother.   ROS:   Please see the history of present illness.    None All other  systems reviewed and are negative.   PHYSICAL EXAM:   VS:  BP 102/76   Pulse (!) 49   Ht 5' 6.5" (1.689 m)   Wt 157 lb (71.2 kg)   BMI 24.96 kg/m    GEN: Well nourished, well developed, in no acute distress  HEENT: normal  Neck: Diminished carotid upstroke bilaterally with delayed peak.  Transmitted bilateral carotid bruits.  No JVD or masses. Cardiac: Grade 4/6 crescendo decrescendo systolic murmur of aortic stenosis.  No regurgitation is heard.  RRR, rubs, or gallops,no edema. Respiratory:  clear to auscultation bilaterally, normal work of breathing GI: soft, nontender, nondistended, + BS MS: no deformity or atrophy  Skin: warm and dry, no  rash Neuro:  Alert and Oriented x 3, Strength and sensation are intact Psych: euthymic mood, full affect  Wt Readings from Last 3 Encounters:  01/24/17 157 lb (71.2 kg)  02/29/16 150 lb (68 kg)  12/28/15 155 lb (70.3 kg)      Studies/Labs Reviewed:   EKG:  EKG sinus bradycardia, first-degree AV block with PR interval 246 ms, prominent voltage, no repolarization abnormality.  When compared to the PR interval from 2017 there has been slight lengthening from 244 ms.  In 2013 the PR interval was 220 ms.  Recent Labs: No results found for requested labs within last 8760 hours.   Lipid Panel No results found for: CHOL, TRIG, HDL, CHOLHDL, VLDL, LDLCALC, LDLDIRECT  Additional studies/ records that were reviewed today include:   Echocardiogram November 2018: Study Conclusions   - Left ventricle: The cavity size was normal. Wall thickness was   increased in a pattern of mild LVH. Systolic function was normal.   The estimated ejection fraction was in the range of 60% to 65%.   Wall motion was normal; there were no regional wall motion   abnormalities. Features are consistent with a pseudonormal left   ventricular filling pattern, with concomitant abnormal relaxation   and increased filling pressure (grade 2 diastolic dysfunction). - Aortic valve: Bicuspid; severely calcified leaflets. Valve   mobility was restricted. There was moderate to severe stenosis. - Mitral valve: Calcified annulus. - Left atrium: The atrium was mildly dilated.   Impressions:   - Normal LV systolic function; moderate diastolic dysfunction; mild   LVH; probable bicuspid aortic valve (heavily calcified with   moderate to severe AS-peak velocity 4.1 m/s; mean gradient 32   mmHg); mild LAE.    ASSESSMENT:    1. Aortic stenosis, severe   2. Bicuspid aortic valve   3. First degree AV block   4. Coronary artery calcification seen on CAT scan   5. Mixed hyperlipidemia   6. Anomalous origin of right  subclavian artery      PLAN:  In order of problems listed above:  1. Echo metrics now qualify as severe aortic stenosis with velocity across the aortic valve slightly above 4.0 m/s.  He remains asymptomatic.  We will repeat an echo in 6 months.  He has e a trace xpressed his strong desire to have nonsurgical aortic valve management if at all possible.  Even discussed the desire to pay out-of-pocket to have TAVR rather than SAVR. 2. No evidence at this point of significant aortic root involvement.  Aortic diameter 3.6 cm on most recent echo.  A chest CT performed 2014 revealed a maximum aortic root diameter of 3.4 cm. 3. Progressive first-degree AV block secondary to conduction system encroachment related to aortic valve calcification. 4. No clinical symptoms to  suggest progression of coronary plaque. 5. Aggressive management of lipids with LDL target less than 70.  Most recently 121.  This needs to be reevaluated.  Probably need to further intensify statin therapy to atorvastatin 80 mg/day. 6. Noted on CT scan in 2014.    Medication Adjustments/Labs and Tests Ordered: Current medicines are reviewed at length with the patient today.  Concerns regarding medicines are outlined above.  Medication changes, Labs and Tests ordered today are listed in the Patient Instructions below. Patient Instructions  Medication Instructions:  Your physician recommends that you continue on your current medications as directed. Please refer to the Current Medication list given to you today.  Labwork: None  Testing/Procedures: Your physician has requested that you have an echocardiogram at the end of May 2019. Echocardiography is a painless test that uses sound waves to create images of your heart. It provides your doctor with information about the size and shape of your heart and how well your heart's chambers and valves are working. This procedure takes approximately one hour. There are no restrictions for this  procedure.    Follow-Up: Your physician wants you to follow-up in: 6 months with Dr. Katrinka Blazing.  You will receive a reminder letter in the mail two months in advance. If you don't receive a letter, please call our office to schedule the follow-up appointment.   Any Other Special Instructions Will Be Listed Below (If Applicable).     If you need a refill on your cardiac medications before your next appointment, please call your pharmacy.      Signed, Lesleigh Noe, MD  01/24/2017 9:51 AM    [facility] [address] [place], [place], [place]  [address] Phone: [phone]; Fax: [phone]

## 2017-01-24 ENCOUNTER — Ambulatory Visit: Payer: PRIVATE HEALTH INSURANCE | Admitting: Interventional Cardiology

## 2017-01-24 ENCOUNTER — Encounter: Payer: Self-pay | Admitting: Interventional Cardiology

## 2017-01-24 VITALS — BP 102/76 | HR 49 | Ht 66.5 in | Wt 157.0 lb

## 2017-01-24 DIAGNOSIS — I440 Atrioventricular block, first degree: Secondary | ICD-10-CM

## 2017-01-24 DIAGNOSIS — I2510 Atherosclerotic heart disease of native coronary artery without angina pectoris: Secondary | ICD-10-CM

## 2017-01-24 DIAGNOSIS — I350 Nonrheumatic aortic (valve) stenosis: Secondary | ICD-10-CM | POA: Diagnosis not present

## 2017-01-24 DIAGNOSIS — Q231 Congenital insufficiency of aortic valve: Secondary | ICD-10-CM | POA: Diagnosis not present

## 2017-01-24 DIAGNOSIS — Q2548 Anomalous origin of subclavian artery: Secondary | ICD-10-CM

## 2017-01-24 DIAGNOSIS — E782 Mixed hyperlipidemia: Secondary | ICD-10-CM | POA: Diagnosis not present

## 2017-01-24 NOTE — Patient Instructions (Addendum)
Medication Instructions:  Your physician recommends that you continue on your current medications as directed. Please refer to the Current Medication list given to you today.  Labwork: None  Testing/Procedures: Your physician has requested that you have an echocardiogram at the end of May 2019. Echocardiography is a painless test that uses sound waves to create images of your heart. It provides your doctor with information about the size and shape of your heart and how well your heart's chambers and valves are working. This procedure takes approximately one hour. There are no restrictions for this procedure.    Follow-Up: Your physician wants you to follow-up in: 6 months with Dr. Katrinka BlazingSmith.  You will receive a reminder letter in the mail two months in advance. If you don't receive a letter, please call our office to schedule the follow-up appointment.   Any Other Special Instructions Will Be Listed Below (If Applicable).     If you need a refill on your cardiac medications before your next appointment, please call your pharmacy.

## 2017-05-12 ENCOUNTER — Telehealth: Payer: Self-pay | Admitting: Interventional Cardiology

## 2017-05-12 NOTE — Telephone Encounter (Signed)
Pt c/o of Chest Pain: STAT if CP now or developed within 24 hours  1. Are you having CP right now? no  2. Are you experiencing any other symptoms (ex. SOB, nausea, vomiting, sweating)? no  3. How long have you been experiencing CP?  Yesterday   4. Is your CP continuous or coming and going?  5-8 min while exercising   5. Have you taken Nitroglycerin?  no ?  ER physician

## 2017-05-12 NOTE — Telephone Encounter (Signed)
Patient was not on the line when call was connected to Triage. Tried to call patient back about chest pain. Left message for patient to call back.

## 2017-05-14 NOTE — Telephone Encounter (Signed)
Per Dr. Katrinka Blazing, he spoke with pt in regards to complaints.

## 2017-06-10 ENCOUNTER — Other Ambulatory Visit (HOSPITAL_COMMUNITY): Payer: PRIVATE HEALTH INSURANCE

## 2017-06-13 ENCOUNTER — Other Ambulatory Visit (HOSPITAL_COMMUNITY): Payer: PRIVATE HEALTH INSURANCE

## 2017-06-24 HISTORY — PX: OTHER SURGICAL HISTORY: SHX169

## 2017-07-06 DIAGNOSIS — Z952 Presence of prosthetic heart valve: Secondary | ICD-10-CM | POA: Insufficient documentation

## 2017-07-06 NOTE — Progress Notes (Signed)
Cardiology Office Note    Date:  07/07/2017   ID:  [name], MD, DOB 07-Aug-1960, MRN [id number]  PCP:  Merlene Laughter, MD  Cardiologist: Lesleigh Noe, MD   Chief Complaint  Patient presents with  . Cardiac Valve Problem    Aortic valve replacement.    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  Underwent minimally invasive right thoracotomy homograft replacement 06/24/2017 by Dr. Ty Hilts at [facility].  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  He is status post bioprosthetic valve conduit replacement of bicuspid valve and ascending aorta at [facility] 06/24/2017.  He is now 2 weeks out from surgery.  He has been at home for approximately 7 days.  Appetite is stable.  No chills or fever.  Pain control has been no difficulty.  He has not used opiates.  He denies palpitations, drainage from his incision which is right upper chest horizontal thoracotomy between the third and fourth rib.  He is sleeping well.  Appetite is good.  Discomfort, orthopnea, or edema.  Past Medical History:  Diagnosis Date  . 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 Bicuspid aortic valve 03/07/2014   ECHOCARDIOGRAM, 2015  Aortic valve: Possibly bicuspid; moderately thickened, moderately calcified leaflets. Cusp separation was reduced. There was moderate stenosis. Peak velocity is 3.68m/s with a mean gradient of .   Marland Kitchen Coronary artery calcification seen on CAT scan 11/28/2014    2014   . Erectile dysfunction 03/07/2014  . Hyperlipidemia 03/07/2014    Past Surgical History:  Procedure Laterality Date  . Aortic valve replacement, bioprosthesis  06/24/2017   Aortic valve aortic root conduit  . none      Current Medications: Outpatient Medications Prior to Visit    Medication Sig Dispense Refill  . aspirin 81 MG tablet Take 81 mg by mouth daily.    Marland Kitchen atorvastatin (LIPITOR) 40 MG tablet Take 40 mg by mouth daily.    . sildenafil (VIAGRA) 50 MG tablet Take 50 mg by mouth daily as needed for erectile dysfunction.     No facility-administered medications prior to visit.      Allergies:   Patient has no known allergies.   Social History   Socioeconomic History  . Marital status: Married    Spouse name: tomasita  . Number of children: 1  . Years of education: college  . Highest education level: Not on file  Occupational History  . Occupation: doctor  Social Needs  . Financial resource strain: Not on file  . Food insecurity:    Worry: Not on file    Inability: Not on file  . Transportation needs:    Medical: Not on file    Non-medical: Not on file  Tobacco Use  . Smoking status: Never Smoker  . Smokeless tobacco: Never Used  Substance and Sexual Activity  . Alcohol use: Yes    Alcohol/week: 0.6 oz    Types: 1 Glasses of wine per week  . Drug use: No  . Sexual activity: Not on file  Lifestyle  . Physical activity:    Days per week: Not on file    Minutes per session: Not on file  . Stress: Not on file  Relationships  . Social connections:    Talks on phone: Not on  file    Gets together: Not on file    Attends religious service: Not on file    Active member of club or organization: Not on file    Attends meetings of clubs or organizations: Not on file    Relationship status: Not on file  Other Topics Concern  . Not on file  Social History Narrative  . Not on file     Family History:  The patient's family history includes Dementia in his mother; Hypertension in his father and mother.   ROS:   Please see the history of present illness.    No complaints. All other systems reviewed and are negative.   PHYSICAL EXAM:   VS:  BP (!) 88/64   Pulse 80   Ht 5\' 7"  (1.702 m)   Wt 146 lb (66.2 kg)   BMI 22.87 kg/m    GEN: Well  nourished, well developed, in no acute distress  HEENT: normal  Neck: no JVD, carotid bruits, or masses Cardiac: RRR; no murmurs, rubs, or gallops,no edema. Respiratory:  clear to auscultation bilaterally, normal work of breathing GI: soft, nontender, nondistended, + BS MS: no deformity or atrophy  Skin: warm and dry, no rash Neuro:  Alert and Oriented x 3, Strength and sensation are intact Psych: euthymic mood, full affect  Wt Readings from Last 3 Encounters:  07/07/17 146 lb (66.2 kg)  01/24/17 157 lb (71.2 kg)  02/29/16 150 lb (68 kg)      Studies/Labs Reviewed:   EKG:  EKG normal sinus rhythm, biatrial abnormality, nonspecific ST abnormality, prominent voltage.  Overall normal appearance.  Recent Labs: No results found for requested labs within last 8760 hours.   Lipid Panel No results found for: CHOL, TRIG, HDL, CHOLHDL, VLDL, LDLCALC, LDLDIRECT  Additional studies/ records that were reviewed today include:  None    ASSESSMENT:    1. Bicuspid aortic valve   2. Coronary artery calcification seen on CAT scan   3. Mixed hyperlipidemia   4. Anomalous origin of right subclavian artery   5. S/P AVR      PLAN:  In order of problems listed above:  1. Status post aortic valve replacement with bioprosthesis and ascending aortic graft done by right thoracotomy.  Stable without postsurgical complications.  Continue aspirin 325 mg/day.  We will do this 1 year then back to 81 mg/day. 2. Coronary angiography demonstrated a 6% mid LAD and 70% diagonal stenosis that was left alone. 3. LDL target is less than 70.  He is on statin therapy.  We will continue current therapy as is.  May need to increase intensity of atorvastatin.  Overall making a good recovery from minimally invasive aortic valve and root replacement.  He has an aortic valve prosthesis.  Medication Adjustments/Labs and Tests Ordered: Current medicines are reviewed at length with the patient today.  Concerns  regarding medicines are outlined above.  Medication changes, Labs and Tests ordered today are listed in the Patient Instructions below. Patient Instructions  Medication Instructions:  Your physician recommends that you continue on your current medications as directed. Please refer to the Current Medication list given to you today.  Labwork: None  Testing/Procedures: None  Follow-Up: Your physician recommends that you schedule a follow-up appointment in: 6 weeks with Dr. Katrinka BlazingSmith.    Any Other Special Instructions Will Be Listed Below (If Applicable).     If you need a refill on your cardiac medications before your next appointment, please call your pharmacy.  Signed, Lesleigh Noe, MD  07/07/2017 9:02 AM    [facility] [address] [place], [place], [place]  [address] Phone: [phone]; Fax: [phone]

## 2017-07-07 ENCOUNTER — Encounter: Payer: Self-pay | Admitting: Interventional Cardiology

## 2017-07-07 ENCOUNTER — Ambulatory Visit: Payer: PRIVATE HEALTH INSURANCE | Admitting: Interventional Cardiology

## 2017-07-07 ENCOUNTER — Encounter (INDEPENDENT_AMBULATORY_CARE_PROVIDER_SITE_OTHER): Payer: Self-pay

## 2017-07-07 VITALS — BP 88/64 | HR 80 | Ht 67.0 in | Wt 146.0 lb

## 2017-07-07 DIAGNOSIS — I2510 Atherosclerotic heart disease of native coronary artery without angina pectoris: Secondary | ICD-10-CM | POA: Diagnosis not present

## 2017-07-07 DIAGNOSIS — Z952 Presence of prosthetic heart valve: Secondary | ICD-10-CM

## 2017-07-07 DIAGNOSIS — Q231 Congenital insufficiency of aortic valve: Secondary | ICD-10-CM

## 2017-07-07 DIAGNOSIS — Q2548 Anomalous origin of subclavian artery: Secondary | ICD-10-CM

## 2017-07-07 DIAGNOSIS — E782 Mixed hyperlipidemia: Secondary | ICD-10-CM | POA: Diagnosis not present

## 2017-07-07 NOTE — Patient Instructions (Signed)
Medication Instructions:  Your physician recommends that you continue on your current medications as directed. Please refer to the Current Medication list given to you today.  Labwork: None  Testing/Procedures: None  Follow-Up: Your physician recommends that you schedule a follow-up appointment in: 6 weeks with Dr. Katrinka BlazingSmith.    Any Other Special Instructions Will Be Listed Below (If Applicable).     If you need a refill on your cardiac medications before your next appointment, please call your pharmacy.

## 2017-08-27 ENCOUNTER — Ambulatory Visit: Payer: PRIVATE HEALTH INSURANCE | Admitting: Interventional Cardiology

## 2018-01-18 IMAGING — MR MR SHOULDER*L* W/O CM
4 of 5 series · 28 of 40 positions shown · non-contrast
Comparison: None.

CLINICAL DATA: Left shoulder pain.

EXAM:
MRI OF THE LEFT SHOULDER WITHOUT CONTRAST
TECHNIQUE: Multiplanar, multisequence MR imaging of the shoulder was performed.
No intravenous contrast was administered.

[Series 3: T2 fat-sat · axial · 4.0mm · 0.25mm/px · z∈[-27,+50]mm · 9 of 19 slices shown (1 of 3)]
[im 1/19]
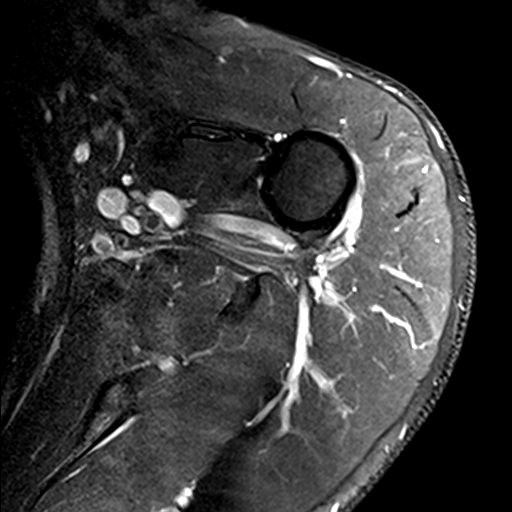
[im 3/19]
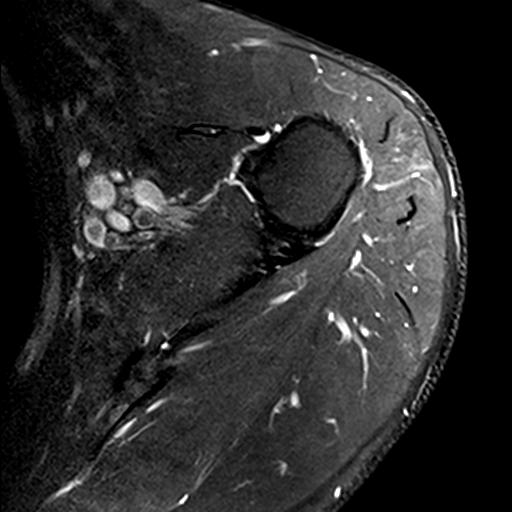
[im 5/19]
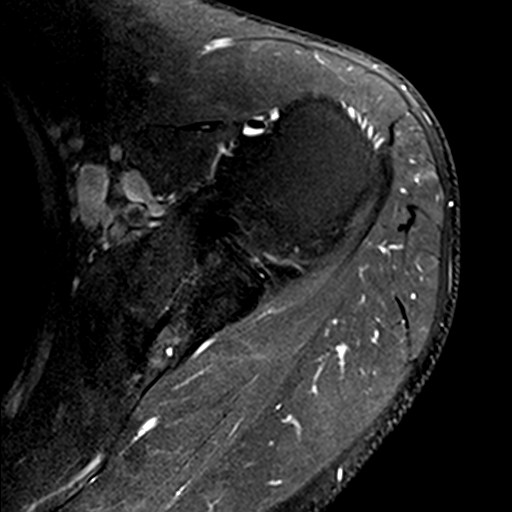
[im 7/19]
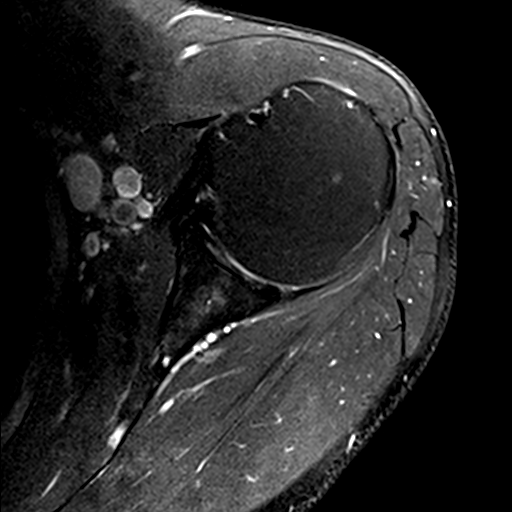
[im 10/19]
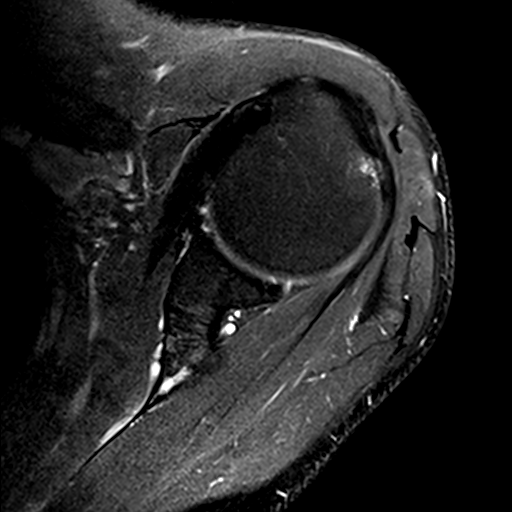
[im 12/19]
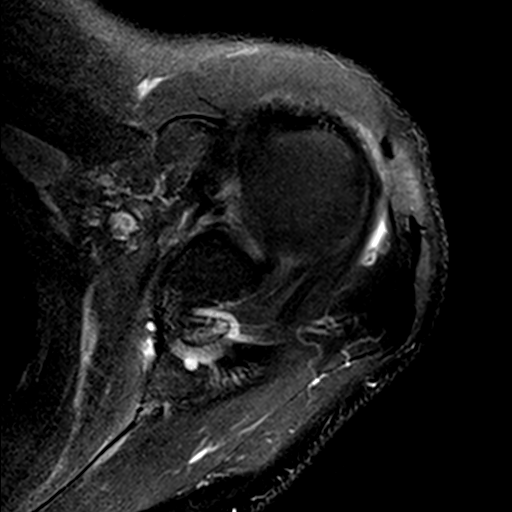
[im 14/19]
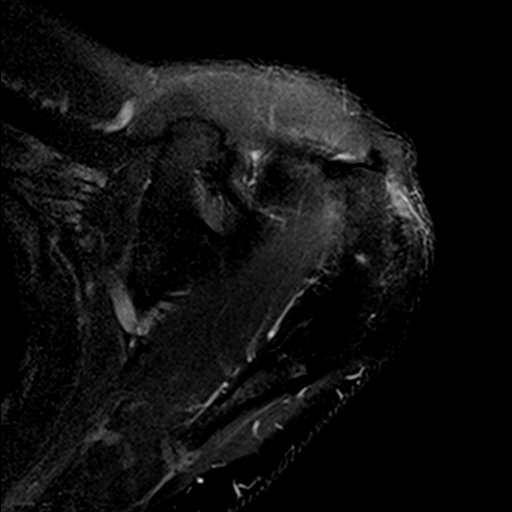
[im 16/19]
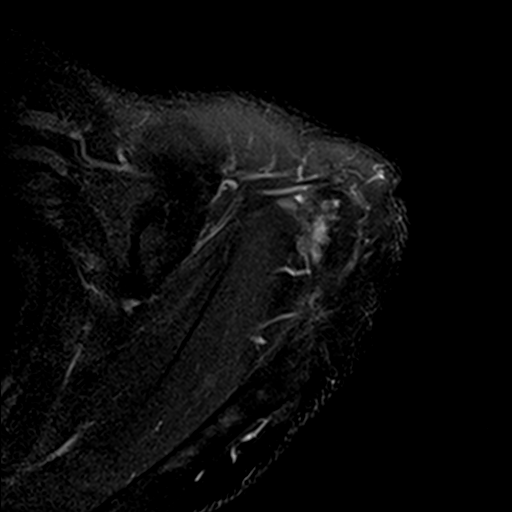
[im 19/19]
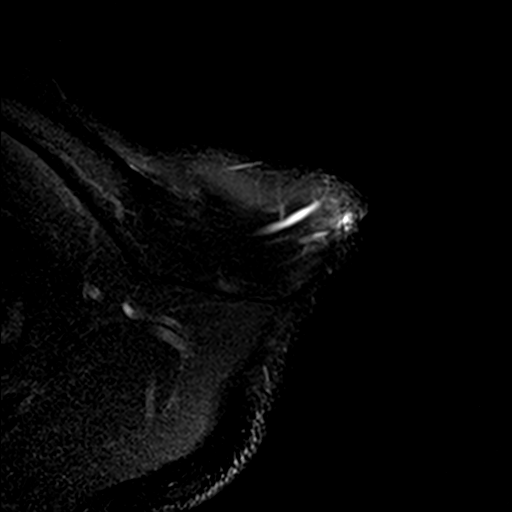

[Series 4: T2 fat-sat · coronal · 4.0mm · 0.55mm/px · 9 of 19 slices shown (2 of 3)]
[im 1/19]
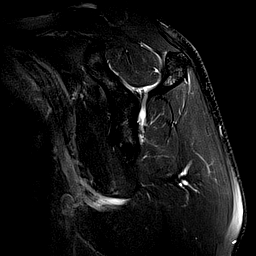
[im 3/19]
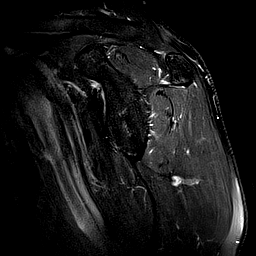
[im 5/19]
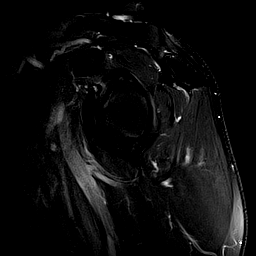
[im 7/19]
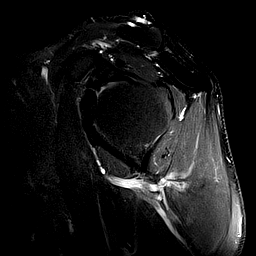
[im 10/19]
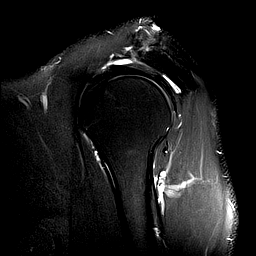
[im 12/19]
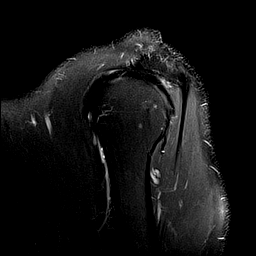
[im 14/19]
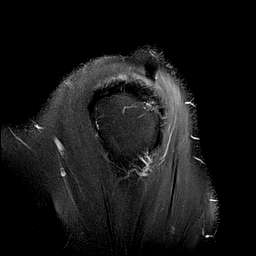
[im 16/19]
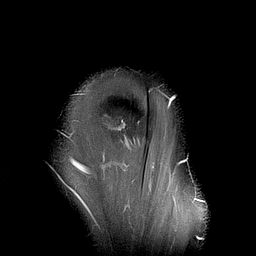
[im 19/19]
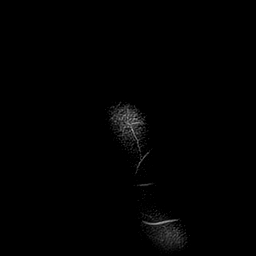

[Series 6: T2 fat-sat · oblique · 4.0mm · 0.55mm/px · 3 of 17 slices shown (3 of 3)]
[im 3/17]
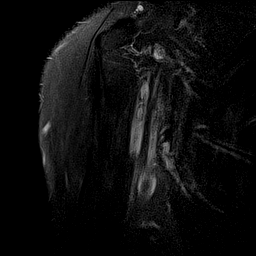
[im 9/17]
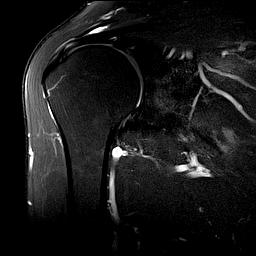
[im 14/17]
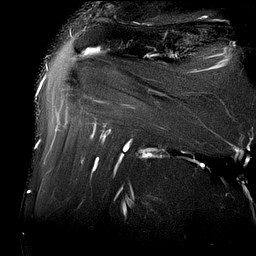

[Series 7: PD · oblique · 4.0mm · 0.27mm/px · 7 of 17 slices shown]
[im 1/17]
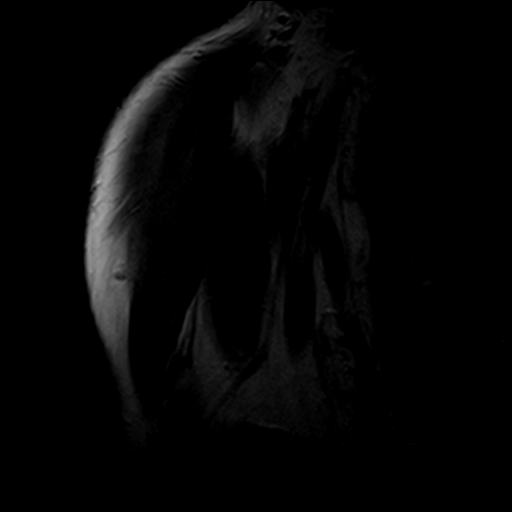
[im 3/17]
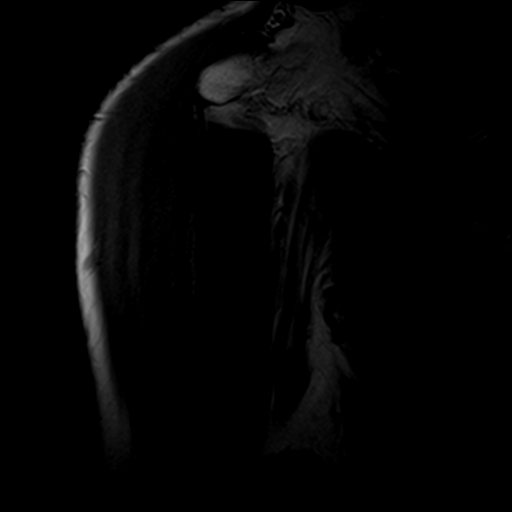
[im 6/17]
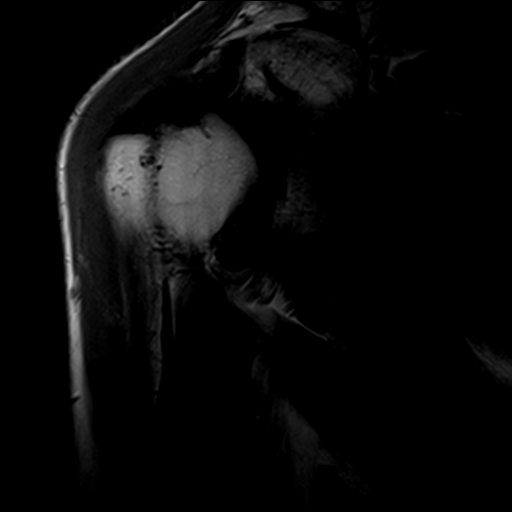
[im 9/17]
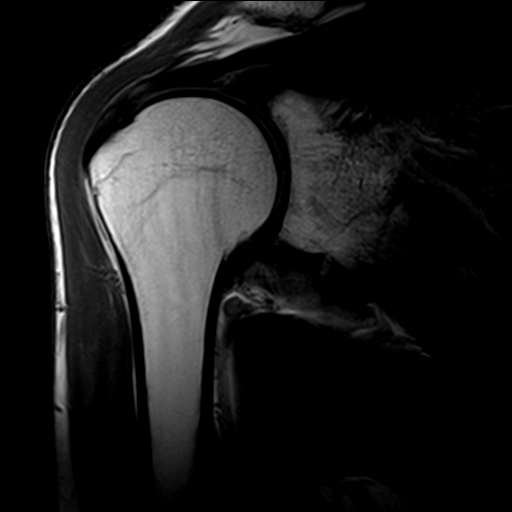
[im 11/17]
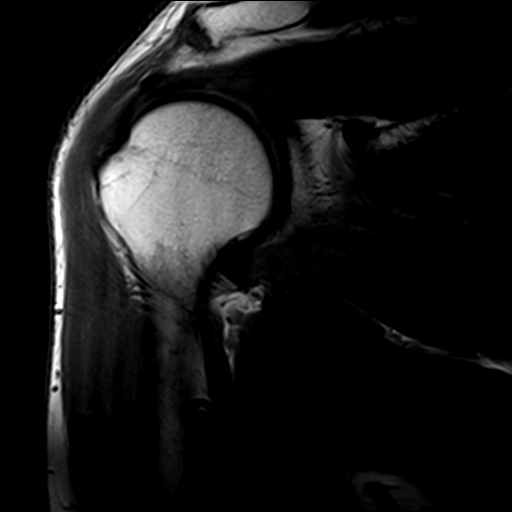
[im 14/17]
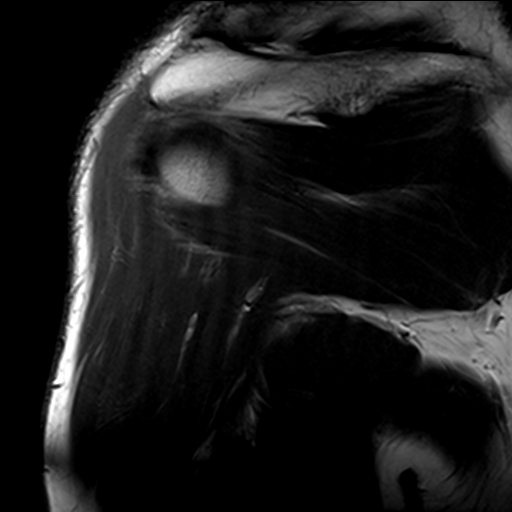
[im 17/17]
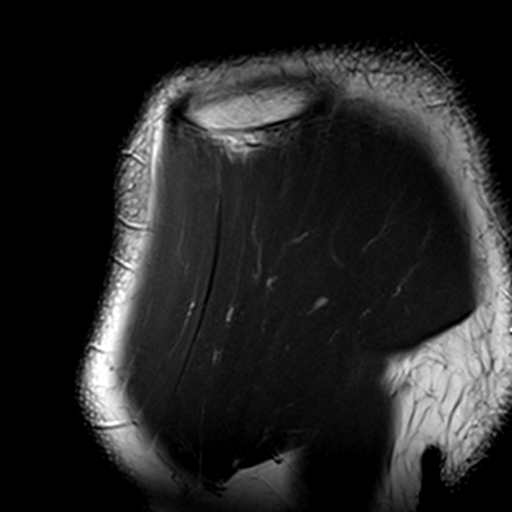

[28 of 40 positions shown; findings below may reference images not displayed]

FINDINGS: Rotator cuff: Intact rotator cuff. Focal tendinopathy of the
supraspinous.

Muscles: No atrophy or abnormal signal of the muscles of the rotator
cuff.

Biceps long head:  Properly located and intact.

Acromioclavicular Joint: Minimal degenerative changes of the AC
joint. Type 1 Acromion. Small amount of fluid in the subacromial
bursa.

Glenohumeral Joint: Normal.

Labrum:  Intact.

Bones:  No marrow abnormality, fracture or dislocation.

Other: None
IMPRESSION: No significant abnormalities. Minimal fluid in the subacromial bursa
is not felt to be significant.

## 2023-04-07 DIAGNOSIS — M25552 Pain in left hip: Secondary | ICD-10-CM | POA: Diagnosis not present

## 2023-05-16 DIAGNOSIS — M1612 Unilateral primary osteoarthritis, left hip: Secondary | ICD-10-CM | POA: Diagnosis not present

## 2023-05-30 DIAGNOSIS — E785 Hyperlipidemia, unspecified: Secondary | ICD-10-CM | POA: Diagnosis not present

## 2023-05-30 DIAGNOSIS — Z01818 Encounter for other preprocedural examination: Secondary | ICD-10-CM | POA: Diagnosis not present

## 2023-05-30 DIAGNOSIS — M1612 Unilateral primary osteoarthritis, left hip: Secondary | ICD-10-CM | POA: Diagnosis not present

## 2023-06-11 DIAGNOSIS — M25752 Osteophyte, left hip: Secondary | ICD-10-CM | POA: Diagnosis not present

## 2023-06-11 DIAGNOSIS — M1612 Unilateral primary osteoarthritis, left hip: Secondary | ICD-10-CM | POA: Diagnosis not present

## 2023-07-15 DIAGNOSIS — Z5189 Encounter for other specified aftercare: Secondary | ICD-10-CM | POA: Diagnosis not present

## 2023-09-22 DIAGNOSIS — E785 Hyperlipidemia, unspecified: Secondary | ICD-10-CM | POA: Diagnosis not present

## 2023-09-22 DIAGNOSIS — Z125 Encounter for screening for malignant neoplasm of prostate: Secondary | ICD-10-CM | POA: Diagnosis not present

## 2023-09-22 DIAGNOSIS — Z0000 Encounter for general adult medical examination without abnormal findings: Secondary | ICD-10-CM | POA: Diagnosis not present

## 2023-09-22 DIAGNOSIS — Z23 Encounter for immunization: Secondary | ICD-10-CM | POA: Diagnosis not present

## 2023-09-25 DIAGNOSIS — R350 Frequency of micturition: Secondary | ICD-10-CM | POA: Diagnosis not present

## 2023-09-25 DIAGNOSIS — R3912 Poor urinary stream: Secondary | ICD-10-CM | POA: Diagnosis not present

## 2023-09-25 DIAGNOSIS — R3911 Hesitancy of micturition: Secondary | ICD-10-CM | POA: Diagnosis not present

## 2023-09-25 DIAGNOSIS — N529 Male erectile dysfunction, unspecified: Secondary | ICD-10-CM | POA: Diagnosis not present

## 2023-09-25 DIAGNOSIS — R351 Nocturia: Secondary | ICD-10-CM | POA: Diagnosis not present

## 2023-09-26 DIAGNOSIS — N529 Male erectile dysfunction, unspecified: Secondary | ICD-10-CM | POA: Diagnosis not present

## 2023-09-26 DIAGNOSIS — Z125 Encounter for screening for malignant neoplasm of prostate: Secondary | ICD-10-CM | POA: Diagnosis not present

## 2023-09-26 DIAGNOSIS — E785 Hyperlipidemia, unspecified: Secondary | ICD-10-CM | POA: Diagnosis not present

## 2023-11-05 DIAGNOSIS — Z5189 Encounter for other specified aftercare: Secondary | ICD-10-CM | POA: Diagnosis not present

## 2023-11-05 DIAGNOSIS — M25552 Pain in left hip: Secondary | ICD-10-CM | POA: Diagnosis not present

## 2023-11-24 DIAGNOSIS — H52203 Unspecified astigmatism, bilateral: Secondary | ICD-10-CM | POA: Diagnosis not present

## 2023-11-24 DIAGNOSIS — H25813 Combined forms of age-related cataract, bilateral: Secondary | ICD-10-CM | POA: Diagnosis not present

## 2023-11-28 DIAGNOSIS — M25552 Pain in left hip: Secondary | ICD-10-CM | POA: Diagnosis not present

## 2023-12-29 DIAGNOSIS — R351 Nocturia: Secondary | ICD-10-CM | POA: Diagnosis not present

## 2023-12-29 DIAGNOSIS — N529 Male erectile dysfunction, unspecified: Secondary | ICD-10-CM | POA: Diagnosis not present

## 2023-12-29 DIAGNOSIS — R3989 Other symptoms and signs involving the genitourinary system: Secondary | ICD-10-CM | POA: Diagnosis not present
